# Patient Record
Sex: Male | Born: 1975 | Race: White | Hispanic: No | Smoking: Current every day smoker
Health system: Southern US, Community
[De-identification: ages and names within clinical notes are randomized; demographics above are authoritative.]

## PROBLEM LIST (undated history)

## (undated) HISTORY — PX: HAND SURGERY: SHX662

---

## 2015-03-31 ENCOUNTER — Emergency Department: Payer: Self-pay

## 2015-03-31 ENCOUNTER — Emergency Department
Admission: EM | Admit: 2015-03-31 | Discharge: 2015-03-31 | Disposition: A | Discharge status: Home / Self Care | Payer: Self-pay | Attending: Emergency Medicine | Admitting: Emergency Medicine

## 2015-03-31 ENCOUNTER — Encounter: Payer: Self-pay | Admitting: Emergency Medicine

## 2015-03-31 DIAGNOSIS — Y998 Other external cause status: Secondary | ICD-10-CM | POA: Insufficient documentation

## 2015-03-31 DIAGNOSIS — M25512 Pain in left shoulder: Secondary | ICD-10-CM

## 2015-03-31 DIAGNOSIS — M7989 Other specified soft tissue disorders: Secondary | ICD-10-CM

## 2015-03-31 DIAGNOSIS — Z880 Allergy status to penicillin: Secondary | ICD-10-CM | POA: Insufficient documentation

## 2015-03-31 DIAGNOSIS — S40212A Abrasion of left shoulder, initial encounter: Secondary | ICD-10-CM | POA: Insufficient documentation

## 2015-03-31 DIAGNOSIS — Y9389 Activity, other specified: Secondary | ICD-10-CM | POA: Insufficient documentation

## 2015-03-31 DIAGNOSIS — Y9289 Other specified places as the place of occurrence of the external cause: Secondary | ICD-10-CM | POA: Insufficient documentation

## 2015-03-31 DIAGNOSIS — F17200 Nicotine dependence, unspecified, uncomplicated: Secondary | ICD-10-CM | POA: Insufficient documentation

## 2015-03-31 DIAGNOSIS — W1839XA Other fall on same level, initial encounter: Secondary | ICD-10-CM | POA: Insufficient documentation

## 2015-03-31 LAB — BASIC METABOLIC PANEL
ANION GAP: 8 (ref 5–15)
BUN: 25 mg/dL — ABNORMAL HIGH (ref 6–20)
CALCIUM: 8.6 mg/dL — AB (ref 8.9–10.3)
CO2: 24 mmol/L (ref 22–32)
CREATININE: 1.54 mg/dL — AB (ref 0.61–1.24)
Chloride: 108 mmol/L (ref 101–111)
GFR, EST NON AFRICAN AMERICAN: 56 mL/min — AB (ref >60)
Glucose, Bld: 100 mg/dL — ABNORMAL HIGH (ref 65–99)
Potassium: 3.9 mmol/L (ref 3.5–5.1)
SODIUM: 140 mmol/L (ref 135–145)

## 2015-03-31 LAB — CBC WITH DIFFERENTIAL/PLATELET
BASOS ABS: 0.1 10*3/uL (ref 0–0.1)
BASOS PCT: 1 %
EOS ABS: 0.5 10*3/uL (ref 0–0.7)
Eosinophils Relative: 5 %
HCT: 40.8 % (ref 40.0–52.0)
HEMOGLOBIN: 14.1 g/dL (ref 13.0–18.0)
Lymphocytes Relative: 22 %
Lymphs Abs: 2.3 10*3/uL (ref 1.0–3.6)
MCH: 30.5 pg (ref 26.0–34.0)
MCHC: 34.6 g/dL (ref 32.0–36.0)
MCV: 88.3 fL (ref 80.0–100.0)
MONOS PCT: 9 %
Monocytes Absolute: 0.9 10*3/uL (ref 0.2–1.0)
NEUTROS PCT: 63 %
Neutro Abs: 6.7 10*3/uL — ABNORMAL HIGH (ref 1.4–6.5)
Platelets: 166 10*3/uL (ref 150–440)
RBC: 4.62 MIL/uL (ref 4.40–5.90)
RDW: 13.4 % (ref 11.5–14.5)
WBC: 10.5 10*3/uL (ref 3.8–10.6)

## 2015-03-31 LAB — CK: CK TOTAL: 1658 U/L — AB (ref 49–397)

## 2015-03-31 MED ORDER — MORPHINE SULFATE (PF) 4 MG/ML IV SOLN
4.0000 mg | Freq: Once | INTRAVENOUS | Status: AC
  Administered 2015-03-31: 4 mg via INTRAVENOUS
  Filled 2015-03-31: qty 1

## 2015-03-31 MED ORDER — MORPHINE SULFATE (PF) 4 MG/ML IV SOLN
4.0000 mg | Freq: Once | INTRAVENOUS | Status: AC
  Administered 2015-03-31: 4 mg via INTRAVENOUS

## 2015-03-31 MED ORDER — ONDANSETRON HCL 4 MG/2ML IJ SOLN
4.0000 mg | Freq: Once | INTRAMUSCULAR | Status: AC
  Administered 2015-03-31: 4 mg via INTRAVENOUS

## 2015-03-31 MED ORDER — IOHEXOL 300 MG/ML  SOLN
75.0000 mL | Freq: Once | INTRAMUSCULAR | Status: AC | PRN
  Administered 2015-03-31: 75 mL via INTRAVENOUS

## 2015-03-31 MED ORDER — ONDANSETRON HCL 4 MG/2ML IJ SOLN
INTRAMUSCULAR | Status: AC
  Administered 2015-03-31: 4 mg via INTRAVENOUS
  Filled 2015-03-31: qty 2

## 2015-03-31 MED ORDER — MORPHINE SULFATE (PF) 4 MG/ML IV SOLN
INTRAVENOUS | Status: AC
  Administered 2015-03-31: 4 mg via INTRAVENOUS
  Filled 2015-03-31: qty 1

## 2015-03-31 NOTE — Discharge Instructions (Signed)
Please seek medical attention for any high fevers, chest pain, shortness of breath, change in behavior, persistent vomiting, bloody stool or any other new or concerning symptoms.   Edema Edema is an abnormal buildup of fluids. It is more common in your legs and thighs. Painless swelling of the feet and ankles is more likely as a person ages. It also is common in looser skin, like around your eyes. HOME CARE   Keep the affected body part above the level of the heart while lying down.  Do not sit still or stand for a long time.  Do not put anything right under your knees when you lie down.  Do not wear tight clothes on your upper legs.  Exercise your legs to help the puffiness (swelling) go down.  Wear elastic bandages or support stockings as told by your doctor.  A low-salt diet may help lessen the puffiness.  Only take medicine as told by your doctor. GET HELP IF:  Treatment is not working.  You have heart, liver, or kidney disease and notice that your skin looks puffy or shiny.  You have puffiness in your legs that does not get better when you raise your legs.  You have sudden weight gain for no reason. GET HELP RIGHT AWAY IF:   You have shortness of breath or chest pain.  You cannot breathe when you lie down.  You have pain, redness, or warmth in the areas that are puffy.  You have heart, liver, or kidney disease and get edema all of a sudden.  You have a fever and your symptoms get worse all of a sudden. MAKE SURE YOU:   Understand these instructions.  Will watch your condition.  Will get help right away if you are not doing well or get worse.   This information is not intended to replace advice given to you by your health care provider. Make sure you discuss any questions you have with your health care provider.   Document Released: 07/11/2007 Document Revised: 01/27/2013 Document Reviewed: 11/14/2012 Elsevier Interactive Patient Education [employer].

## 2015-03-31 NOTE — ED Notes (Signed)
Returned from xray /US.

## 2015-03-31 NOTE — ED Notes (Signed)
Pt reports falling on left shoulder last week.  Significant swelling in left arm.

## 2015-03-31 NOTE — ED Provider Notes (Signed)
[facility] Emergency Department Provider Note   ____________________________________________  Time seen: Approximately 11:45 AM I have reviewed the triage vital signs and the triage nursing note.  HISTORY  Chief Complaint Arm Injury   Historian Patient  HPI Connor Hurley is a 40 y.o. male fell and injured his left shoulder moving furniture last week. He actually fell off of a low deck and landed on his posterior left shoulder/scapular area. He's had persistent shoulder pain along the "rotator cuff" and points along the posterior lateral and anterior aspect of the shoulder. He states he's also noted a significant swelling of his forearm and hand and entire left side. No chest pain or trouble breathing. Patient has moderate to severe. Moving it makes it worse.   No pleuritic chest pain. No trouble breathing. Pain is moderate to severe in the left arm. History reviewed. No pertinent past medical history.  There are no active problems to display for this patient.   Past Surgical History  Procedure Laterality Date  . Hand surgery      No current outpatient prescriptions on file.  Allergies Penicillins  History reviewed. No pertinent family history.  Social History Social History  Substance Use Topics  . Smoking status: Current Every Day Smoker  . Smokeless tobacco: None  . Alcohol Use: None    Review of Systems  Constitutional: Negative for fever. Eyes: Negative for visual changes. ENT: Negative for sore throat. Cardiovascular: Negative for chest pain. Respiratory: Negative for shortness of breath. Gastrointestinal: Negative for abdominal pain, vomiting and diarrhea. Genitourinary: Negative for dysuria. Musculoskeletal: Negative for back pain. Skin: Negative for rash. Neurological: Negative for headache. 10 point Review of Systems otherwise negative ____________________________________________   PHYSICAL EXAM:  VITAL SIGNS: ED  Triage Vitals  Enc Vitals Group     BP 03/31/15 1136 141/85 mmHg     Pulse Rate 03/31/15 1136 75     Resp 03/31/15 1136 18     Temp 03/31/15 1136 98.6 F (37 C)     Temp Source 03/31/15 1136 Oral     SpO2 03/31/15 1136 99 %     Weight 03/31/15 1136 180 lb (81.647 kg)     Height 03/31/15 1136  (1.676 m)     Head Cir --      Peak Flow --      Pain Score 03/31/15 1136 7     Pain Loc --      Pain Edu? --      Excl. in GC? --      Constitutional: Alert and oriented. Well appearing and in no distress. HEENT   Head: Normocephalic and atraumatic.      Eyes: Conjunctivae are normal. PERRL. Normal extraocular movements.      Ears:         Nose: No congestion/rhinnorhea.   Mouth/Throat: Mucous membranes are moist.   Neck: No stridor. Cardiovascular/Chest: Normal rate, regular rhythm.  No murmurs, rubs, or gallops. Respiratory: Normal respiratory effort without tachypnea nor retractions. Breath sounds are clear and equal bilaterally. No wheezes/rales/rhonchi. Gastrointestinal: Soft. No distention, no guarding, no rebound. Nontender.    Genitourinary/rectal:Deferred Musculoskeletal: Healing abrasion left posterior shoulder. Tenderness to palpation along the shoulder margin with mild swelling. Moderate to severe swelling of the upper arm at the level of the elbow and the entire forearm and hand. Soft but swollen soft tissues Neurologic:  Normal speech and language. No gross or focal neurologic deficits are appreciated. Skin:  Skin is warm, dry and  intact. No rash noted. Psychiatric: Mood and affect are normal. Speech and behavior are normal. Patient exhibits appropriate insight and judgment.  ____________________________________________   EKG I, Governor Rooks, MD, the attending physician have personally viewed and interpreted all ECGs.  None ____________________________________________  LABS (pertinent  positives/negatives)  None  ____________________________________________  RADIOLOGY All Xrays were viewed by me. Imaging interpreted by Radiologist.  Chest 1 view: No active disease Shoulder complete: No acute findings Left elbow complete: Diffuse soft tissue swelling without acute bony abnormalities  Ultrasound venous upper extremity:  No evidence of DVT in the left upper extremity  CT chest with contrast: Pending __________________________________________  PROCEDURES  Procedure(s) performed: None  Critical Care performed: None  ____________________________________________   ED COURSE / ASSESSMENT AND PLAN  Pertinent labs & imaging results that were available during my care of the patient were reviewed by me and considered in my medical decision making (see chart for details).   Patient here with left shoulder pain after traumatic injury and now with entire left arm swollen. I don't think this is compartment syndrome and he has normal distal pulses with normal sensation and movement.  X-rays negative for acute bony abnormality.  Ultrasound negative for DVT.  Clinically this does not look like cellulitis to me, although the arm is swollen, it is not really red, he has had a fever, his white blood count is normal although there is a slight left shift.  I will CT the chest to rule out IVC issue or thoracic outlet syndrome as a source of the acute left arm swelling.  Patient care transferred to Dr. Derrill Kay at shift change 3 PM. CT of the chest pending. Disposition depending on chest CT.   CONSULTATIONS:   None   Patient / Family / Caregiver informed of clinical course, medical decision-making process, and agree with plan.   I discussed return precautions, follow-up instructions, and discharged instructions with patient and/or family.   ___________________________________________   FINAL CLINICAL IMPRESSION(S) / ED DIAGNOSES   Final diagnoses:  Left shoulder  pain  Left arm swelling              Note: This dictation was prepared with Dragon dictation. Any transcriptional errors that result from this process are unintentional   Governor Rooks, MD 03/31/15 [phone]

## 2016-09-13 IMAGING — CT CT CHEST W/ CM
2 of 3 series · 16 of 46 positions shown, 18 images · IV contrast (omnipaque)
Comparison: Ultrasound 03/31/2015. Left shoulder radiographs
03/31/2015.

CLINICAL DATA: Persistent left shoulder pain after falling from a
deck last week. Negative ultrasound for DVT.

EXAM:
CT CHEST WITH CONTRAST
TECHNIQUE: Multidetector CT imaging of the chest was performed during
intravenous contrast administration.
CONTRAST:  75mL OMNIPAQUE IOHEXOL 300 MG/ML  SOLN

[Series 2: routine chest with · axial · 0.86mm/px · z∈[-546,-231]mm · 13 of 73 slices shown, 15 images]
[im 5/73  soft-tissue]
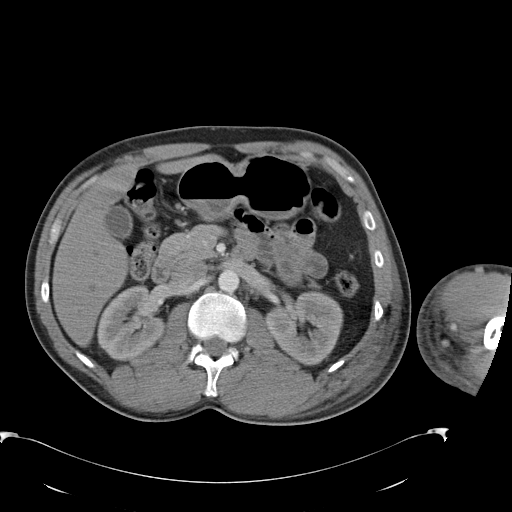
[im 5/73  bone]
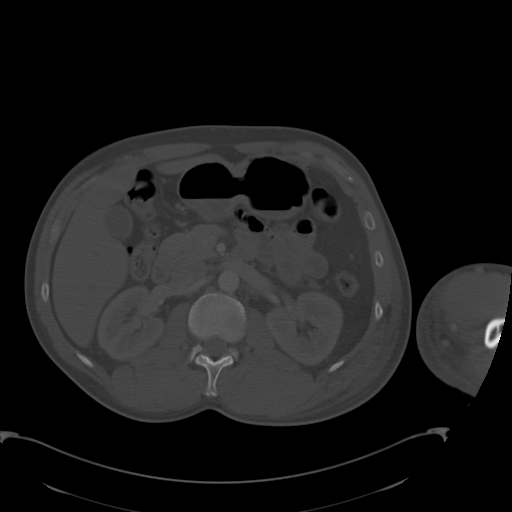
[im 10/73  soft-tissue]
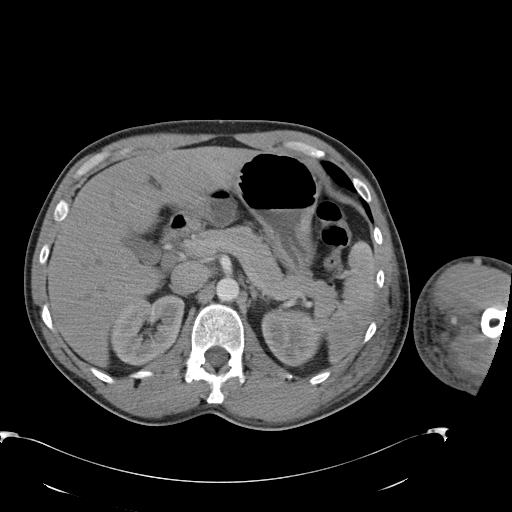
[im 14/73  soft-tissue]
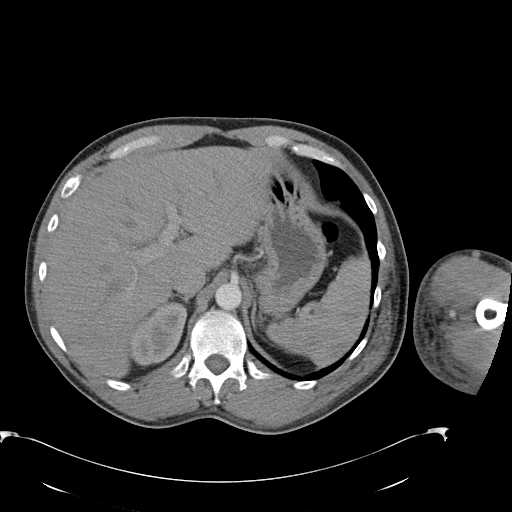
[im 21/73  soft-tissue]
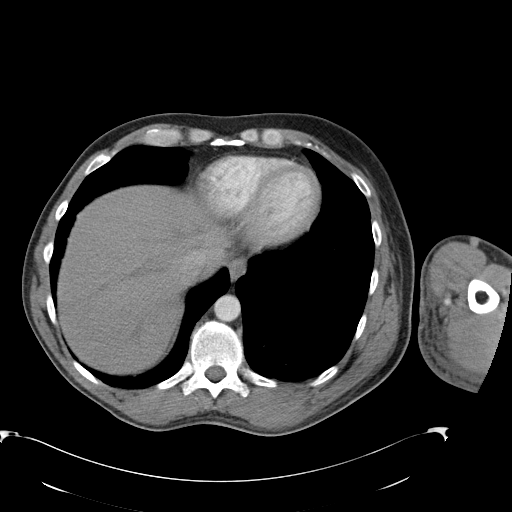
[im 26/73  soft-tissue]
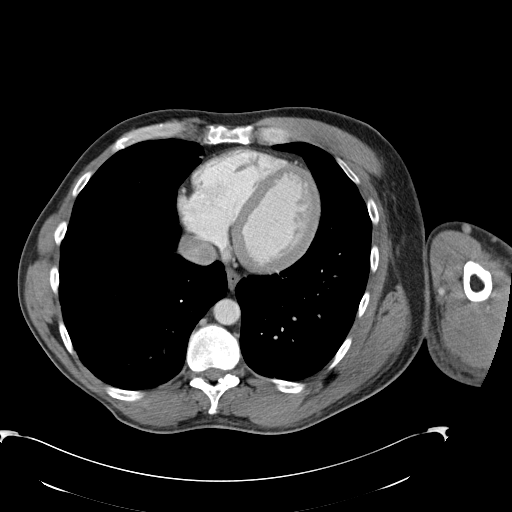
[im 31/73  soft-tissue]
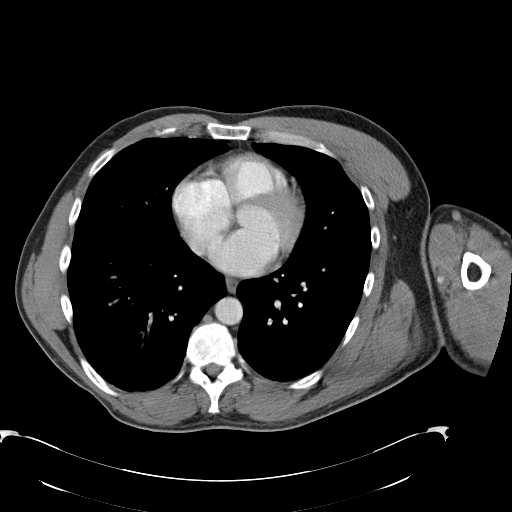
[im 38/73  soft-tissue]
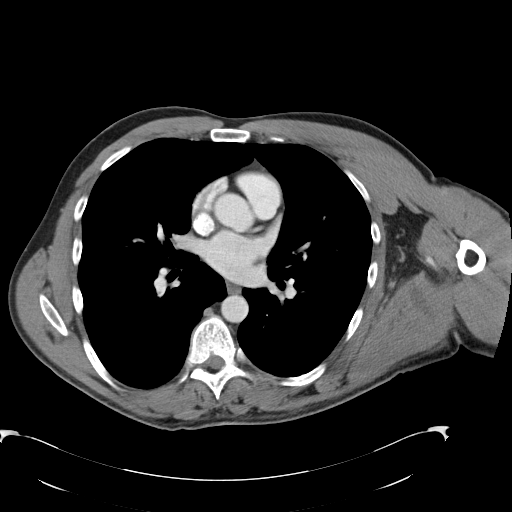
[im 42/73  soft-tissue]
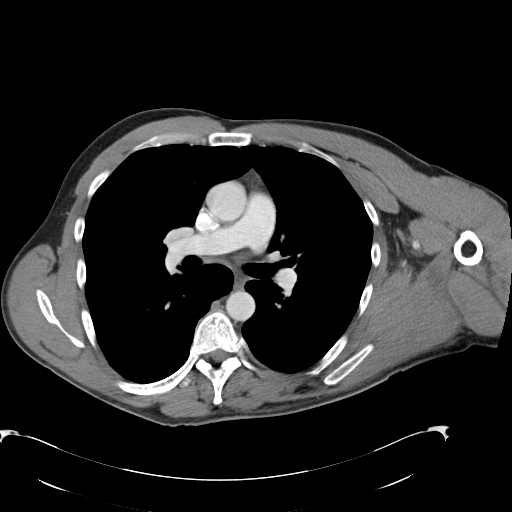
[im 47/73  soft-tissue]
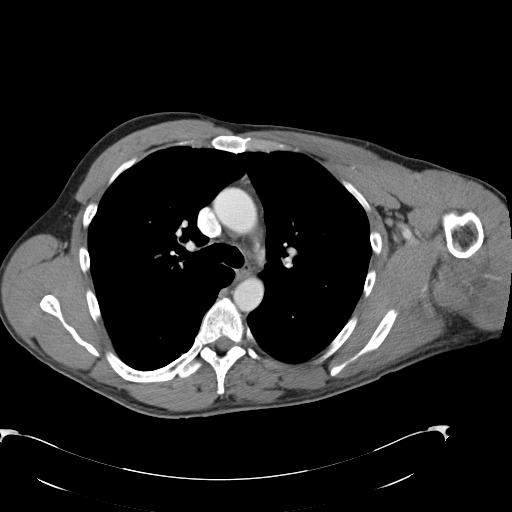
[im 47/73  bone]
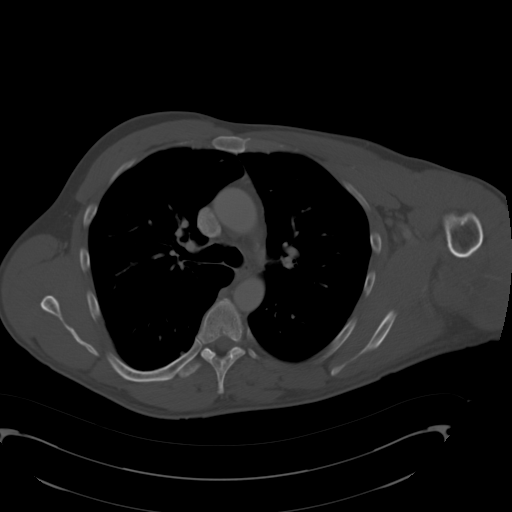
[im 52/73  soft-tissue]
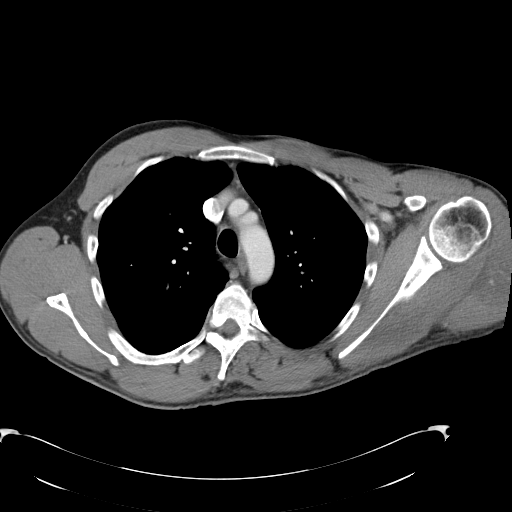
[im 59/73  soft-tissue]
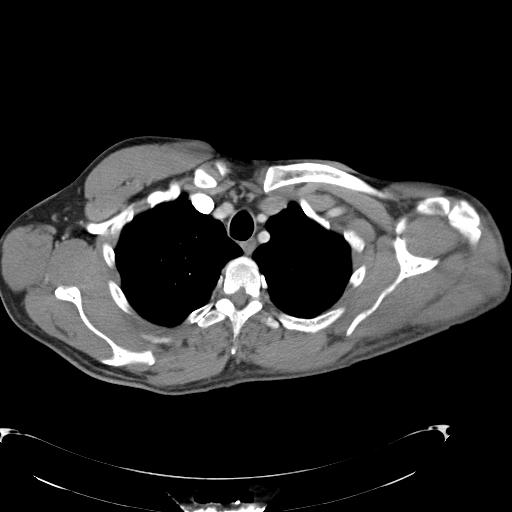
[im 63/73  soft-tissue]
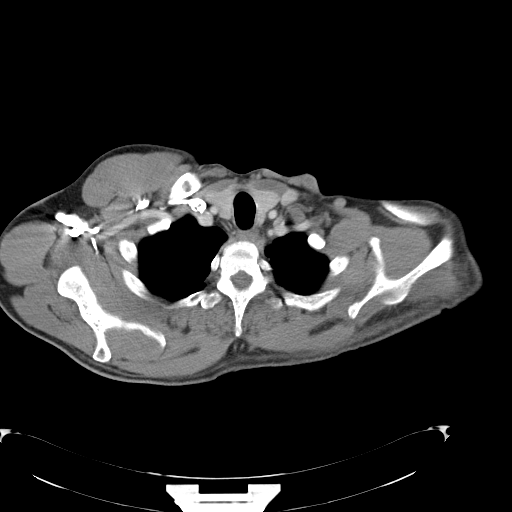
[im 68/73  soft-tissue]
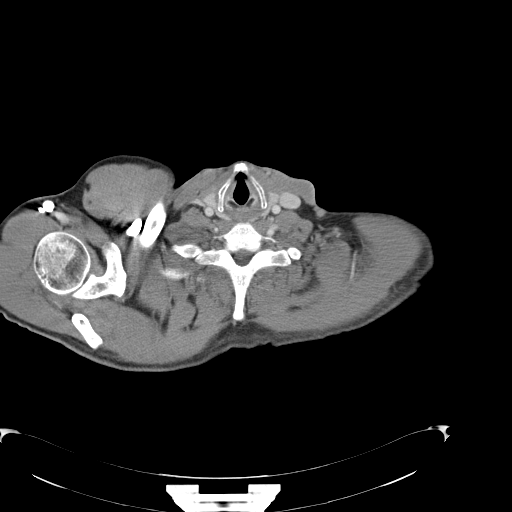

[Series 5: routine chest with cor · coronal · 0.78mm/px · 3 of 140 slices shown]
[im 47/140  soft-tissue]
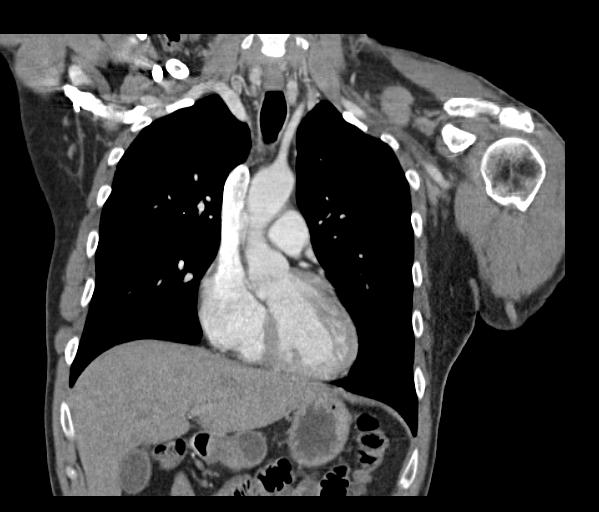
[im 62/140  soft-tissue]
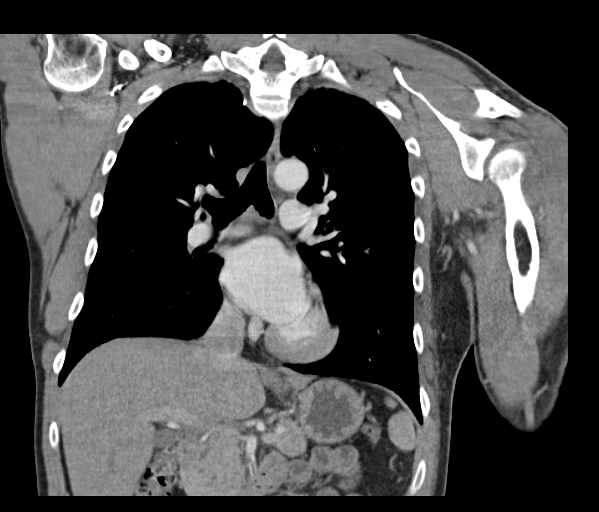
[im 78/140  soft-tissue]
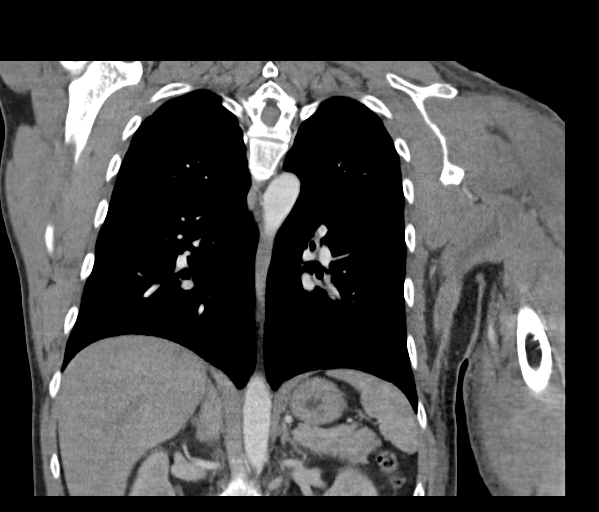

[16 of 46 positions shown; findings below may reference images not displayed]

FINDINGS: Mediastinum/Nodes: There are no enlarged mediastinal, hilar or
axillary lymph nodes. The thyroid gland, trachea and esophagus
demonstrate no significant findings. The heart size is normal. There
is no pericardial effusion. There are no significant vascular
findings.

Lungs/Pleura: There is no pleural effusion. There is minimal
dependent atelectasis at both lung bases. There is somewhat nodular
subpleural opacity in the right middle lobe adjacent to the heart
border, measuring up to 1.3 cm on image 45. Scattered additional
tiny right lung nodules are of doubtful significance. There is no
confluent airspace opacity.

Upper abdomen:  Unremarkable.  There is no adrenal mass.

Musculoskeletal/Chest wall: No chest wall mass or suspicious osseous
findings demonstrated within the thorax. There is no evidence of rib
fracture. There is subcutaneous edema in the left shoulder region,
extending into the visualized portions of the left upper arm. There
is probable contusion posteriorly in the deltoid and the teres major
muscle. There is no evidence of acute fracture or dislocation. There
is an unfused os acromiale. The visualize left upper arm vasculature
appears unremarkable.
IMPRESSION: 1. Right middle lobe subpleural nodularity, likely atelectasis or
possibly contusion. Chest radiographic follow up recommended.
2. No evidence of acute mediastinal or chest wall injury.
3. Soft tissue injury in the left upper arm and shoulder region with
probable contusion in the deltoid and teres major musculature. No
acute osseous findings seen.
